# Patient Record
Sex: Female | Born: 1990 | Race: Black or African American | Hispanic: No | Marital: Single | Smoking: Never smoker
Health system: Southern US, Community
[De-identification: ages and names within clinical notes are randomized; demographics above are authoritative.]

---
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
## 2011-03-01 IMAGING — CR DG ABDOMEN 1V
1 series · 1 of 1 positions shown · non-contrast
Comparison: None.

CLINICAL DATA: Left upper quadrant abdominal pain

ABDOMEN - 1 VIEW

[t abdomen supine]
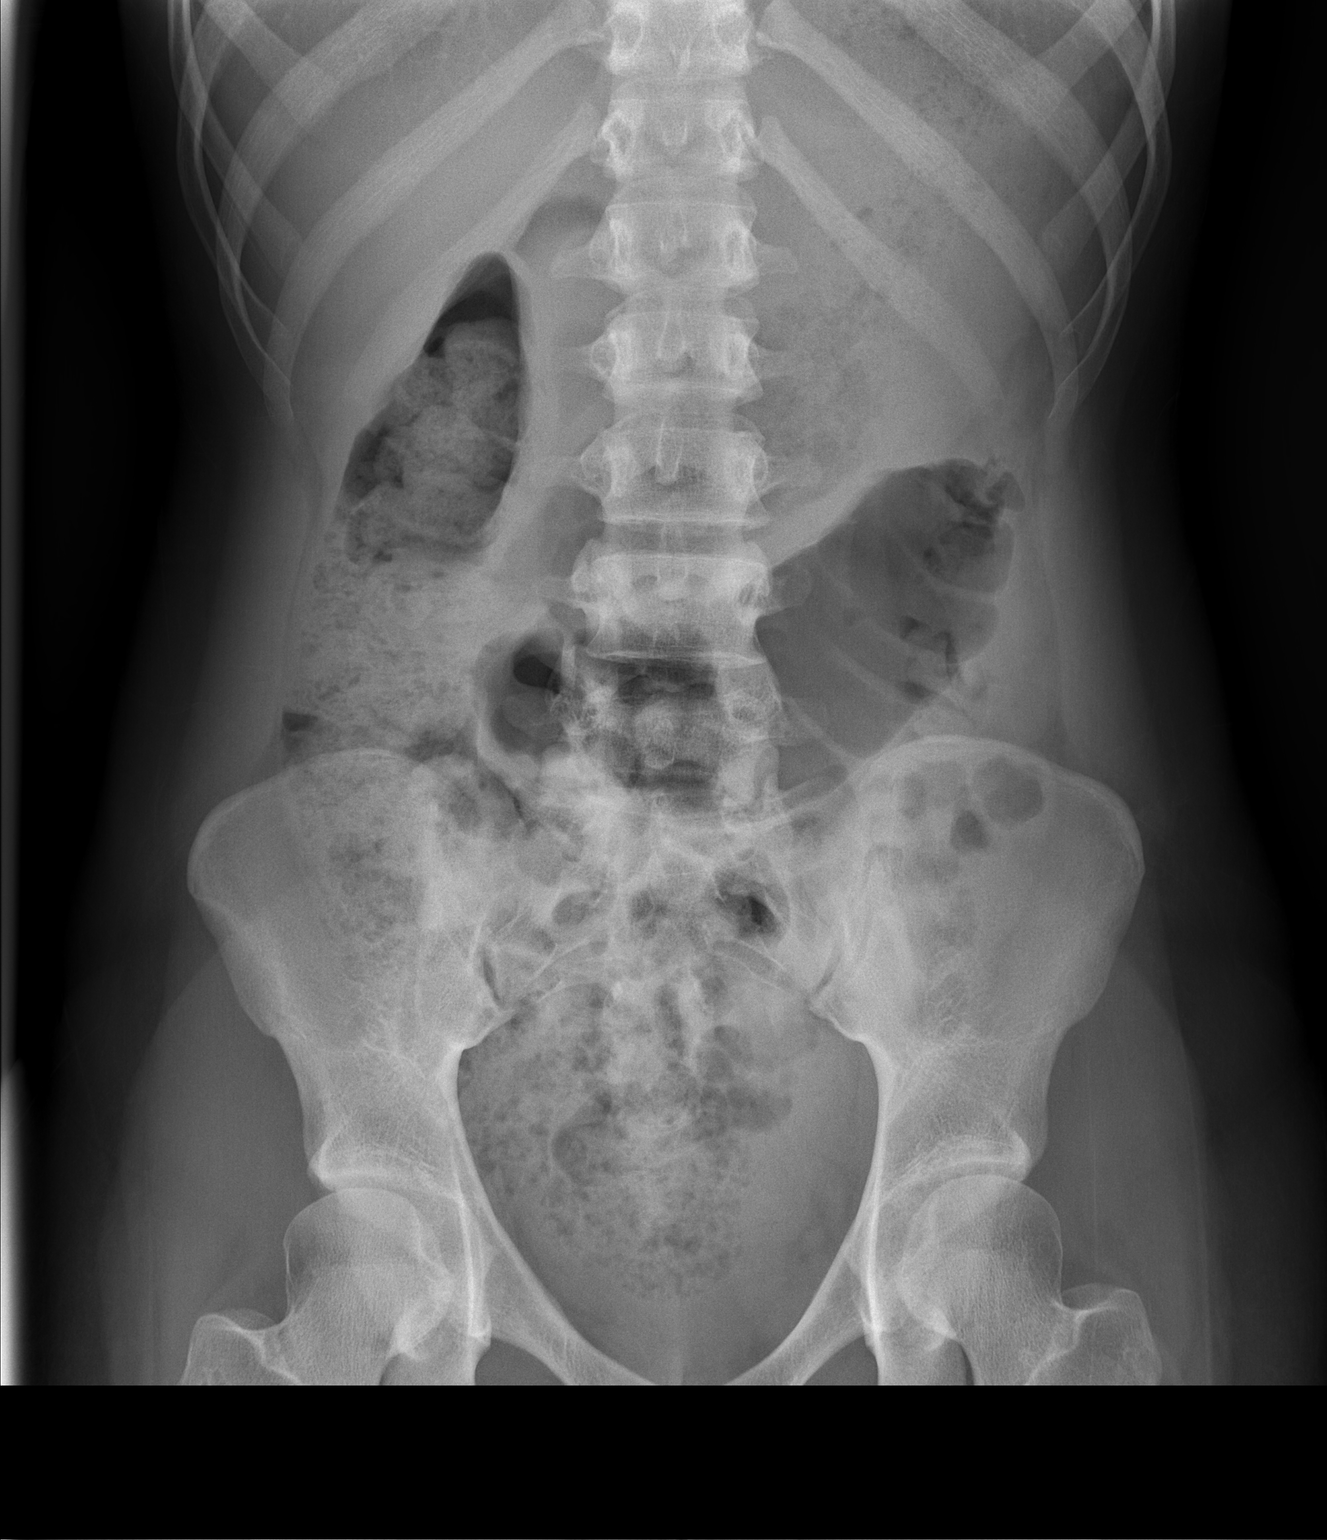

[1 of 1 positions shown; findings below may reference images not displayed]

FINDINGS: Large amount of retained stool throughout the colon.  Mid
abdominal dilated bowel loop with a diameter of 6 cm.  Nonspecific
in appearance by supine imaging.  No abnormal calcifications or
acute bony abnormality.
IMPRESSION: Large amount retained stool in the colon
Mid abdominal dilated bowel loop, nonspecific by supine imaging.

## 2015-08-06 ENCOUNTER — Encounter (HOSPITAL_BASED_OUTPATIENT_CLINIC_OR_DEPARTMENT_OTHER): Payer: Self-pay

## 2015-08-06 ENCOUNTER — Emergency Department (HOSPITAL_BASED_OUTPATIENT_CLINIC_OR_DEPARTMENT_OTHER)
Admission: EM | Admit: 2015-08-06 | Discharge: 2015-08-06 | Disposition: A | Discharge status: Home / Self Care | Payer: Worker's Compensation | Attending: Emergency Medicine | Admitting: Emergency Medicine

## 2015-08-06 DIAGNOSIS — K047 Periapical abscess without sinus: Secondary | ICD-10-CM | POA: Insufficient documentation

## 2015-08-06 DIAGNOSIS — J029 Acute pharyngitis, unspecified: Secondary | ICD-10-CM | POA: Insufficient documentation

## 2015-08-06 LAB — RAPID STREP SCREEN (MED CTR MEBANE ONLY): STREPTOCOCCUS, GROUP A SCREEN (DIRECT): NEGATIVE

## 2015-08-06 MED ORDER — TRAMADOL HCL 50 MG PO TABS: 50.0000 mg | ORAL_TABLET | Freq: Four times a day (QID) | ORAL | Status: AC | PRN

## 2015-08-06 MED ORDER — KETOROLAC TROMETHAMINE 60 MG/2ML IM SOLN
60.0000 mg | Freq: Once | INTRAMUSCULAR | Status: AC
  Administered 2015-08-06: 60 mg via INTRAMUSCULAR
  Filled 2015-08-06: qty 2

## 2015-08-06 MED ORDER — IBUPROFEN 600 MG PO TABS: 600.0000 mg | ORAL_TABLET | Freq: Three times a day (TID) | ORAL | Status: AC | PRN

## 2015-08-06 MED ORDER — PENICILLIN V POTASSIUM 250 MG PO TABS
500.0000 mg | ORAL_TABLET | Freq: Once | ORAL | Status: AC
  Administered 2015-08-06: 500 mg via ORAL
  Filled 2015-08-06: qty 2

## 2015-08-06 MED ORDER — PENICILLIN V POTASSIUM 500 MG PO TABS: 500.0000 mg | ORAL_TABLET | Freq: Four times a day (QID) | ORAL | Status: AC

## 2015-08-06 NOTE — ED Notes (Signed)
Right upper toothache x 2 days-NAD-steady gait

## 2015-08-06 NOTE — Discharge Instructions (Signed)

## 2015-08-06 NOTE — ED Provider Notes (Signed)
CSN: [id number]     Arrival date & time 08/06/15  1501 History  By signing my name below, I, Janice Estes, attest that this documentation has been prepared under the direction and in the presence of Loren Raceravid Traeh Milroy, MD. Electronically Signed: Alyssa GroveMartin Estes, ED Scribe. 08/06/2015. 4:08 PM.   Chief Complaint  Patient presents with  . Dental Pain    The history is provided by the patient. No language interpreter was used.    HPI Comments: Janice SpaceKatrina Rosenau is a 25 y.o. female who presents to the Emergency Department complaining of gradual onset, constant dental pain located around her lower right wisdom tooth onset 2 days. Pt reports associated swelling to area, chills, and pain when swallowing. Pt states that her wisdom tooth is coming in. Pt has felt similar dental pain for a year, but reports symptoms have worsened recently. She has taken Ibuprofen today with no relief to symptoms. Pt has also used ginger to help with the inflammation. Pt denies fever. No speech changes or difficulty breathing.  History reviewed. No pertinent past medical history. History reviewed. No pertinent past surgical history. No family history on file. Social History  Substance Use Topics  . Smoking status: Never Smoker   . Smokeless tobacco: None  . Alcohol Use: No   OB History    No data available     Review of Systems  Constitutional: Positive for chills. Negative for fever.  HENT: Positive for dental problem, facial swelling and sore throat (when swallowing). Negative for sinus pressure, trouble swallowing and voice change.   Respiratory: Negative for cough and shortness of breath.   Gastrointestinal: Negative for nausea, vomiting and abdominal pain.  Musculoskeletal: Negative for back pain and neck pain.  Skin: Negative for rash and wound.  Neurological: Negative for dizziness, weakness, light-headedness, numbness and headaches.  All other systems reviewed and are negative.  Allergies  Other  Home  Medications   Prior to Admission medications   Medication Sig Start Date End Date Taking? Authorizing Provider  ibuprofen (ADVIL,MOTRIN) 600 MG tablet Take 1 tablet (600 mg total) by mouth 3 (three) times daily with meals as needed for moderate pain. 08/06/15   Loren Raceravid Keaston Pile, MD  penicillin v potassium (VEETID) 500 MG tablet Take 1 tablet (500 mg total) by mouth 4 (four) times daily. 08/06/15 08/13/15  Loren Raceravid Reegan Bouffard, MD  traMADol (ULTRAM) 50 MG tablet Take 1 tablet (50 mg total) by mouth every 6 (six) hours as needed for moderate pain or severe pain. 08/06/15   Loren Raceravid Zowie Lundahl, MD   BP 117/78 mmHg  Pulse 98  Temp(Src) 98.4 F (36.9 C) (Oral)  Resp 16  Ht 5\' 4"  (1.626 m)  Wt 130 lb (58.968 kg)  BMI 22.30 kg/m2  SpO2 100%  LMP 07/18/2015 Physical Exam  Constitutional: She is oriented to person, place, and time. She appears well-developed and well-nourished. No distress.  HENT:  Head: Normocephalic and atraumatic.  Mouth/Throat: Oropharynx is clear and moist. No oropharyngeal exudate.  No trismus Impacted upper and lower wisdom teeth bilaterally. No palpable masses or area of fluctuance. Patient has bilateral tonsillar hypertrophy, right greater than left. Mildly erythematous. Uvula is midline.  Eyes: EOM are normal. Pupils are equal, round, and reactive to light.  Neck: Normal range of motion. Neck supple.  Submental swelling noted on the right side.  Cardiovascular: Normal rate and regular rhythm.  Exam reveals no gallop and no friction rub.   No murmur heard. Pulmonary/Chest: Effort normal and breath sounds normal. No respiratory  distress. She has no wheezes. She has no rales. She exhibits no tenderness.  Abdominal: Soft. Bowel sounds are normal. She exhibits no distension and no mass. There is no tenderness. There is no rebound and no guarding.  Musculoskeletal: Normal range of motion. She exhibits no edema or tenderness.  Lymphadenopathy:    She has no cervical adenopathy.   Neurological: She is alert and oriented to person, place, and time.  Moves all extremities without deficit. Sensation fully intact. Speaks in a clear voice.  Skin: Skin is warm and dry. No rash noted. No erythema.  Psychiatric: She has a normal mood and affect. Her behavior is normal.  Nursing note and vitals reviewed.   ED Course  Procedures (including critical care time)  DIAGNOSTIC STUDIES: Oxygen Saturation is 100% on RA, normal by my interpretation.    COORDINATION OF CARE: 3:43 PM Discussed treatment plan with pt  Labs Review Labs Reviewed  RAPID STREP SCREEN ([facility])  CULTURE, GROUP A STREP [facility])    Imaging Review No results found. I have personally reviewed and evaluated these images and lab results as part of my medical decision-making.   EKG Interpretation None      MDM   Final diagnoses:  Dental abscess    Strep is negative. We'll treat with penicillin and have follow-up with the patient's dentist. Patient does need to return for worsening pain or swelling, difficulty breathing or swallowing.  Loren Raceravid Illa Enlow, MD 08/06/15 [phone]

## 2015-08-09 LAB — CULTURE, GROUP A STREP (THRC)

## 2021-12-09 ENCOUNTER — Emergency Department (HOSPITAL_BASED_OUTPATIENT_CLINIC_OR_DEPARTMENT_OTHER)
Admission: EM | Admit: 2021-12-09 | Discharge: 2021-12-09 | Disposition: A | Discharge status: Home / Self Care | Payer: BC Managed Care – PPO | Attending: Emergency Medicine | Admitting: Emergency Medicine

## 2021-12-09 ENCOUNTER — Encounter (HOSPITAL_BASED_OUTPATIENT_CLINIC_OR_DEPARTMENT_OTHER): Payer: Self-pay | Admitting: Urology

## 2021-12-09 ENCOUNTER — Other Ambulatory Visit: Payer: Self-pay

## 2021-12-09 DIAGNOSIS — M542 Cervicalgia: Secondary | ICD-10-CM | POA: Insufficient documentation

## 2021-12-09 DIAGNOSIS — R5383 Other fatigue: Secondary | ICD-10-CM | POA: Insufficient documentation

## 2021-12-09 DIAGNOSIS — M791 Myalgia, unspecified site: Secondary | ICD-10-CM | POA: Diagnosis not present

## 2021-12-09 DIAGNOSIS — R6883 Chills (without fever): Secondary | ICD-10-CM | POA: Diagnosis not present

## 2021-12-09 DIAGNOSIS — J069 Acute upper respiratory infection, unspecified: Secondary | ICD-10-CM

## 2021-12-09 DIAGNOSIS — Z20822 Contact with and (suspected) exposure to covid-19: Secondary | ICD-10-CM | POA: Diagnosis not present

## 2021-12-09 DIAGNOSIS — R519 Headache, unspecified: Secondary | ICD-10-CM | POA: Diagnosis present

## 2021-12-09 LAB — RESP PANEL BY RT-PCR (FLU A&B, COVID) ARPGX2
Influenza A by PCR: NEGATIVE
Influenza B by PCR: NEGATIVE
SARS Coronavirus 2 by RT PCR: NEGATIVE

## 2021-12-09 NOTE — ED Provider Notes (Signed)
[facility] Provider Note   CSN: [id number] Arrival date & time: 12/09/21  1251     History  Chief Complaint  Patient presents with   Headache    Janice Estes is a 31 y.o. female.  With no significant past medical history presents the ED for evaluation of 3 days of intermittent headaches, chills, body aches and fatigue. She describes the headache as a constant ache. Localizes it across the forehead. Rates it at a 6 to 8 out of 10. Also reports neck pain that is intermittent and described as an ache. Has not tried any home remedies because she does not like to take medications. States she has been eating and drinking less because she does not have an appetite. Denies fevers, cough, shortness of breath, throat pain, neck stiffness. No known sick contacts.    Headache      Home Medications Prior to Admission medications   Medication Sig Start Date End Date Taking? Authorizing Provider  ibuprofen (ADVIL,MOTRIN) 600 MG tablet Take 1 tablet (600 mg total) by mouth 3 (three) times daily with meals as needed for moderate pain. 08/06/15   Julianne Rice, MD  traMADol (ULTRAM) 50 MG tablet Take 1 tablet (50 mg total) by mouth every 6 (six) hours as needed for moderate pain or severe pain. 08/06/15   Julianne Rice, MD      Allergies    Other and Penicillins    Review of Systems   Review of Systems  Constitutional:  Positive for chills.  Neurological:  Positive for headaches.  All other systems reviewed and are negative.   Physical Exam Updated Vital Signs BP (!) 144/86 (BP Location: Right Arm)   Pulse 83   Temp 98.4 F (36.9 C) (Oral)   Resp 16   Ht 5\' 4"  (1.626 m)   Wt 70.3 kg   LMP 11/06/2021 (Approximate)   SpO2 99%   BMI 26.61 kg/m  Physical Exam Vitals and nursing note reviewed.  Constitutional:      General: She is not in acute distress.    Appearance: Normal appearance. She is well-developed and normal weight. She is not  ill-appearing.  HENT:     Head: Normocephalic and atraumatic.     Right Ear: Hearing, tympanic membrane, ear canal and external ear normal.     Left Ear: Hearing, tympanic membrane, ear canal and external ear normal.     Mouth/Throat:     Mouth: Mucous membranes are moist.     Pharynx: Oropharynx is clear. Uvula midline. No pharyngeal swelling, oropharyngeal exudate or posterior oropharyngeal erythema.     Tonsils: No tonsillar exudate or tonsillar abscesses.  Eyes:     Extraocular Movements: Extraocular movements intact.     Pupils: Pupils are equal, round, and reactive to light.  Neck:     Meningeal: Brudzinski's sign and Kernig's sign absent.  Pulmonary:     Effort: Pulmonary effort is normal. No respiratory distress.  Abdominal:     General: Abdomen is flat.  Musculoskeletal:        General: Normal range of motion.     Cervical back: Normal range of motion and neck supple. No rigidity.  Lymphadenopathy:     Cervical: No cervical adenopathy.  Skin:    General: Skin is warm and dry.  Neurological:     Mental Status: She is alert and oriented to person, place, and time.  Psychiatric:        Mood and Affect: Mood normal.  Behavior: Behavior normal.     ED Results / Procedures / Treatments   Labs (all labs ordered are listed, but only abnormal results are displayed) Labs Reviewed  RESP PANEL BY RT-PCR (FLU A&B, COVID) [id number]    EKG None  Radiology No results found.  Procedures Procedures    Medications Ordered in ED Medications - No data to display  ED Course/ Medical Decision Making/ A&P                           Medical Decision Making This patient presents to the ED for concern of URI symptoms, this involves an extensive number of treatment options, and is a complaint that carries with it a high risk of complications and morbidity.  The differential diagnosis includes viral URI, meningitis, pneumonia   My initial workup includes respiratory panel.  Patient declined tylenol in ED.  Additional history obtained from: Nursing notes from this visit.  I ordered, reviewed and interpreted labs which include: Respiratory panel. Negative.  Afebrile, hemodynamically stable. Patient is a healthy 31 year old female who presents to the ED for evaluation of 3 days of URI symptoms. Respiratory panel was negative. Physical exam was unremarkable. I believe patient likely has a viral URI. We discussed symptomatic management and patient was agreeable to trying tylenol and ibuprofen at home. I encouraged patient to establish care with a PCP. Gave patient strict return precautions. Stable at discharge.    At this time there does not appear to be any evidence of an acute emergency medical condition and the patient appears stable for discharge with appropriate outpatient follow up. Diagnosis was discussed with patient who verbalizes understanding of care plan and is agreeable to discharge. I have discussed return precautions with patient who verbalizes understanding. Patient encouraged to follow-up with their PCP within 1 week. All questions answered.   Note: Portions of this report may have been transcribed using voice recognition software. Every effort was made to ensure accuracy; however, inadvertent computerized transcription errors may still be present.          Final Clinical Impression(s) / ED Diagnoses Final diagnoses:  None    Rx / DC Orders ED Discharge Orders     None         Michelle Piper, Cordelia Poche 12/09/21 1450    Jacalyn Lefevre, MD 12/09/21 1517

## 2021-12-09 NOTE — Discharge Instructions (Addendum)
You have been seen today for your complaint of headache, chills, body aches. Your lab work was normal. Home care instructions are as follows:  You should take Tylenol and ibuprofen as needed for headaches and body aches.  You should monitor for signs of fever.  You should drink plenty of fluids and continue to eat a normal diet. Follow up with: your PCP within 1 week Please seek immediate medical care if you develop any of the following symptoms: You have shortness of breath that gets worse. You have very bad or constant: Headache. Ear pain. Pain in your forehead, behind your eyes, and over your cheekbones (sinus pain). Chest pain. You have long-lasting (chronic) lung disease along with any of these: Making high-pitched whistling sounds when you breathe, most often when you breathe out (wheezing). Long-lasting cough (more than 14 days). Coughing up blood. A change in your usual mucus. You have a stiff neck. You have changes in your: Vision. Hearing. Thinking. Mood. At this time there does not appear to be the presence of an emergent medical condition, however there is always the potential for conditions to change. Please read and follow the below instructions.  Do not take your medicine if  develop an itchy rash, swelling in your mouth or lips, or difficulty breathing; call 911 and seek immediate emergency medical attention if this occurs.  You may review your lab tests and imaging results in their entirety on your MyChart account.  Please discuss all results of fully with your primary care provider and other specialist at your follow-up visit.  Note: Portions of this text may have been transcribed using voice recognition software. Every effort was made to ensure accuracy; however, inadvertent computerized transcription errors may still be present.

## 2021-12-09 NOTE — ED Triage Notes (Signed)
Pt reports HA, chills, and fatigue x 3 days  Denies fever at home, reports slight nausea, no vomiting  Pt states "a lot of stress at home"

## 2023-03-31 ENCOUNTER — Emergency Department (HOSPITAL_BASED_OUTPATIENT_CLINIC_OR_DEPARTMENT_OTHER)
Admission: EM | Admit: 2023-03-31 | Discharge: 2023-04-01 | Disposition: A | Discharge status: Home / Self Care | Payer: Medicaid Other | Attending: Emergency Medicine | Admitting: Emergency Medicine

## 2023-03-31 ENCOUNTER — Encounter (HOSPITAL_BASED_OUTPATIENT_CLINIC_OR_DEPARTMENT_OTHER): Payer: Self-pay | Admitting: Emergency Medicine

## 2023-03-31 ENCOUNTER — Other Ambulatory Visit: Payer: Self-pay

## 2023-03-31 DIAGNOSIS — H5789 Other specified disorders of eye and adnexa: Secondary | ICD-10-CM | POA: Diagnosis not present

## 2023-03-31 DIAGNOSIS — H5711 Ocular pain, right eye: Secondary | ICD-10-CM | POA: Diagnosis present

## 2023-03-31 MED ORDER — TETRACAINE HCL 0.5 % OP SOLN: 2.0000 [drp] | Freq: Once | OPHTHALMIC | Status: DC

## 2023-03-31 MED ORDER — TETRACAINE HCL 0.5 % OP SOLN
2.0000 [drp] | Freq: Once | OPHTHALMIC | Status: AC
  Administered 2023-03-31: 2 [drp] via OPHTHALMIC
  Filled 2023-03-31: qty 4

## 2023-03-31 MED ORDER — ERYTHROMYCIN 5 MG/GM OP OINT: TOPICAL_OINTMENT | OPHTHALMIC | 0 refills | Status: DC

## 2023-03-31 MED ORDER — ERYTHROMYCIN 5 MG/GM OP OINT: TOPICAL_OINTMENT | OPHTHALMIC | 0 refills | Status: AC

## 2023-03-31 MED ORDER — FLUORESCEIN SODIUM 1 MG OP STRP
1.0000 | ORAL_STRIP | Freq: Once | OPHTHALMIC | Status: AC
  Administered 2023-03-31: 1 via OPHTHALMIC
  Filled 2023-03-31: qty 1

## 2023-03-31 MED ORDER — FLUORESCEIN SODIUM 1 MG OP STRP: 1.0000 | ORAL_STRIP | Freq: Once | OPHTHALMIC | Status: DC

## 2023-03-31 NOTE — ED Triage Notes (Signed)
 Pt c/o clear drainage and itching to right eye x 2 months. She has taken abx and steroids without improvement. Pt denies significant eye pain but notes that her tear duct is occasionally sore. No vision changes.

## 2023-03-31 NOTE — Discharge Instructions (Addendum)
 Your evaluated in the emergency room for eye pain and drainage.  Your symptoms are likely due to allergic/viral conjunctivitis however given the length of your symptoms.  Antibiotic eyedrops were sent to your pharmacy.  Please be sure to complete the course of eyedrops.  You were additionally provided contact information for ophthalmologist.  Please call them tomorrow to schedule an appointment.  If you experience any new or worsening symptoms including extreme worsening pain loss of vision please return to the emergency room.

## 2023-03-31 NOTE — ED Provider Notes (Signed)
 [place] [facility] Provider Note   CSN: [id number] Arrival date & time: 03/31/23  1909     History  Chief Complaint  Patient presents with   Eye Drainage    Janice Estes is a 33 y.o. female who presents with right eye pain and drainage x 2 months.  Patient states she was evaluated by her primary care provider.  Was felt to be consistent with sinusitis and was treated with oral antibiotic.  Reports she had no improvement of her symptoms.  States she sometimes feels foreign body sensation.  Denies any cough or congestion.  She does not wear contacts.  HPI     Home Medications Prior to Admission medications   Medication Sig Start Date End Date Taking? Authorizing Provider  erythromycin ophthalmic ointment Place a 1/2 inch ribbon of ointment into the lower eyelid every 6 hours x 7 days. 03/31/23  Yes Halford Decamp, PA-C  ibuprofen (ADVIL,MOTRIN) 600 MG tablet Take 1 tablet (600 mg total) by mouth 3 (three) times daily with meals as needed for moderate pain. 08/06/15   Loren Racer, MD  traMADol (ULTRAM) 50 MG tablet Take 1 tablet (50 mg total) by mouth every 6 (six) hours as needed for moderate pain or severe pain. 08/06/15   Loren Racer, MD      Allergies    Other and Penicillins    Review of Systems   Review of Systems  Eyes:  Positive for discharge.    Physical Exam Updated Vital Signs BP 121/78   Pulse 72   Temp 97.8 F (36.6 C)   Resp 17   Ht 5\' 3"  (1.6 m)   Wt 87.1 kg   LMP 03/10/2023 (Approximate)   SpO2 99%   BMI 34.01 kg/m  Physical Exam Vitals and nursing note reviewed.  Constitutional:      General: She is not in acute distress.    Appearance: She is well-developed.  HENT:     Head: Normocephalic and atraumatic.  Eyes:     Comments: Right eye with clear drainage, no conjunctival injection, painless EOMI, PERRL, no periorbital swelling, left eye without abnormalities.  Pulmonary:     Effort: Pulmonary  effort is normal. No respiratory distress.  Musculoskeletal:     Cervical back: Neck supple.  Skin:    General: Skin is warm and dry.     Capillary Refill: Capillary refill takes less than 2 seconds.  Neurological:     Mental Status: She is alert.  Psychiatric:        Mood and Affect: Mood normal.     ED Results / Procedures / Treatments   Labs (all labs ordered are listed, but only abnormal results are displayed) Labs Reviewed - No data to display  EKG None  Radiology No results found.  Procedures Procedures    Medications Ordered in ED Medications  tetracaine (PONTOCAINE) 0.5 % ophthalmic solution 2 drop (2 drops Right Eye Given by Other 03/31/23 2149)  fluorescein ophthalmic strip 1 strip (1 strip Right Eye Given by Other 03/31/23 2150)    ED Course/ Medical Decision Making/ A&P                                 Medical Decision Making Risk Prescription drug management.   This patient presents to the ED with chief complaint(s) of eye drainage .  The complaint involves an extensive differential diagnosis and also  carries with it a high risk of complications and morbidity.   pertinent past medical history as listed in HPI  The differential diagnosis includes  Conjunctivitis, corneal abrasion, orbital cellulitis, glaucoma The initial plan is to  Will start with visual acuity, IOP and fluorescein Additional history obtained: Records reviewed Care Everywhere/External Records  Initial Assessment:   Hemodynamically stable, afebrile patient presenting with complaints of eye pain and drainage x 2 months.  Drainage is clear.  She has no conjunctival injection.  IOP and fluorescein exams reassuring.  Visual acuity with mild decreased vision.  Symptoms are likely secondary to allergic/viral conjunctivitis but given the longevity of her symptoms we will start her on erythromycin and refer to ophthalmology.  Independent ECG interpretation:  none  Independent labs  interpretation:  The following labs were independently interpreted:  none  Independent visualization and interpretation of imaging: none  Treatment and Reassessment: Visual acuity: Bilateral Distance: 20/20 R Distance: 20/25 L Distance: 20/25  IOP: symmetric < 20 Fluorescein: without uptake  Consultations obtained:   none  Disposition:   Patient will be discharged home on prescription for erythromycin.  Provided referral for ophthalmology.  The patient has been appropriately medically screened and/or stabilized in the ED. I have low suspicion for any other emergent medical condition which would require further screening, evaluation or treatment in the ED or require inpatient management. At time of discharge the patient is hemodynamically stable and in no acute distress. I have discussed work-up results and diagnosis with patient and answered all questions. Patient is agreeable with discharge plan. We discussed strict return precautions for returning to the emergency department and they verbalized understanding.     Social Determinants of Health:   none  This note was dictated with voice recognition software.  Despite best efforts at proofreading, errors may have occurred which can change the documentation meaning.          Final Clinical Impression(s) / ED Diagnoses Final diagnoses:  Eye drainage    Rx / DC Orders ED Discharge Orders          Ordered    erythromycin ophthalmic ointment        03/31/23 2306              Halford Decamp, PA-C 03/31/23 2309    Virgina Norfolk, DO 03/31/23 2316
